# Patient Record
Sex: Male | Born: 1955 | Race: White | Hispanic: No | Marital: Married | Smoking: Current every day smoker
Health system: Southern US, Community
[De-identification: ages and names within clinical notes are randomized; demographics above are authoritative.]

## PROBLEM LIST (undated history)

## (undated) DIAGNOSIS — E7800 Pure hypercholesterolemia, unspecified: Secondary | ICD-10-CM

## (undated) DIAGNOSIS — K219 Gastro-esophageal reflux disease without esophagitis: Secondary | ICD-10-CM

## (undated) DIAGNOSIS — I519 Heart disease, unspecified: Secondary | ICD-10-CM

## (undated) DIAGNOSIS — H905 Unspecified sensorineural hearing loss: Secondary | ICD-10-CM

## (undated) DIAGNOSIS — I639 Cerebral infarction, unspecified: Secondary | ICD-10-CM

## (undated) DIAGNOSIS — G2581 Restless legs syndrome: Secondary | ICD-10-CM

## (undated) DIAGNOSIS — F32A Depression, unspecified: Secondary | ICD-10-CM

## (undated) DIAGNOSIS — M1990 Unspecified osteoarthritis, unspecified site: Secondary | ICD-10-CM

## (undated) DIAGNOSIS — F329 Major depressive disorder, single episode, unspecified: Secondary | ICD-10-CM

## (undated) DIAGNOSIS — J449 Chronic obstructive pulmonary disease, unspecified: Secondary | ICD-10-CM

## (undated) DIAGNOSIS — I10 Essential (primary) hypertension: Secondary | ICD-10-CM

## (undated) DIAGNOSIS — I219 Acute myocardial infarction, unspecified: Secondary | ICD-10-CM

## (undated) HISTORY — DX: Cerebral infarction, unspecified: I63.9

## (undated) HISTORY — DX: Heart disease, unspecified: I51.9

## (undated) HISTORY — DX: Restless legs syndrome: G25.81

## (undated) HISTORY — DX: Acute myocardial infarction, unspecified: I21.9

## (undated) HISTORY — DX: Gastro-esophageal reflux disease without esophagitis: K21.9

## (undated) HISTORY — DX: Depression, unspecified: F32.A

## (undated) HISTORY — DX: Essential (primary) hypertension: I10

## (undated) HISTORY — DX: Pure hypercholesterolemia, unspecified: E78.00

## (undated) HISTORY — DX: Unspecified osteoarthritis, unspecified site: M19.90

## (undated) HISTORY — DX: Unspecified sensorineural hearing loss: H90.5

## (undated) HISTORY — DX: Chronic obstructive pulmonary disease, unspecified: J44.9

---
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
## 2013-09-09 ENCOUNTER — Encounter (INDEPENDENT_AMBULATORY_CARE_PROVIDER_SITE_OTHER): Payer: Self-pay | Admitting: *Deleted

## 2013-10-14 ENCOUNTER — Other Ambulatory Visit (INDEPENDENT_AMBULATORY_CARE_PROVIDER_SITE_OTHER): Payer: Self-pay | Admitting: *Deleted

## 2013-10-14 ENCOUNTER — Encounter (INDEPENDENT_AMBULATORY_CARE_PROVIDER_SITE_OTHER): Payer: Self-pay | Admitting: Internal Medicine

## 2013-10-14 ENCOUNTER — Encounter (INDEPENDENT_AMBULATORY_CARE_PROVIDER_SITE_OTHER): Payer: Self-pay | Admitting: *Deleted

## 2013-10-14 ENCOUNTER — Ambulatory Visit (INDEPENDENT_AMBULATORY_CARE_PROVIDER_SITE_OTHER): Payer: Non-veteran care | Admitting: Internal Medicine

## 2013-10-14 VITALS — BP 128/70 | HR 76 | Temp 98.2°F | Ht 68.0 in | Wt 175.3 lb

## 2013-10-14 DIAGNOSIS — R195 Other fecal abnormalities: Secondary | ICD-10-CM

## 2013-10-14 DIAGNOSIS — I10 Essential (primary) hypertension: Secondary | ICD-10-CM

## 2013-10-14 DIAGNOSIS — I639 Cerebral infarction, unspecified: Secondary | ICD-10-CM | POA: Insufficient documentation

## 2013-10-14 DIAGNOSIS — E7800 Pure hypercholesterolemia, unspecified: Secondary | ICD-10-CM

## 2013-10-14 DIAGNOSIS — I6350 Cerebral infarction due to unspecified occlusion or stenosis of unspecified cerebral artery: Secondary | ICD-10-CM

## 2013-10-14 NOTE — Progress Notes (Signed)
   Subjective:    Patient ID: Nicholas Blackburn, male    DOB: [birth year], 57 y.o.   MRN: [id number]  HPI Referred to our office by Vanetta Mulders [facility] for blood in stool. [facility]. Wife states the [place] is backed up. Last colonoscopy was 2000 and was normal. Next colonoscopy in 10 yrs. He did have one positive card  8 weeks. Appetite is good. No weight loss. No abdominal pain. Occasionally has acid reflux if he eats spicy foods. He tries to avoid spicy foods. Usually has a BM about twice a day. No melena or BRRB.  His stools are formed.   08/25/2013 H and H 15.4 and 45.2.    Review of Systems Past Medical History  Diagnosis Date  . Hypertension   . CVA (cerebral infarction)   . MI (myocardial infarction)     1st of June and clarified by Dr. Mikael Spray at the [facility]  . High cholesterol     Past Surgical History  Procedure Laterality Date  . None      No Known Allergies  No current outpatient prescriptions on file prior to visit.   No current facility-administered medications on file prior to visit.   Current Outpatient Prescriptions  Medication Sig Dispense Refill  . amLODipine (NORVASC) 10 MG tablet Take 10 mg by mouth daily.      Marland Kitchen aspirin 325 MG EC tablet Take 325 mg by mouth daily.      . carbidopa-levodopa (PARCOPA) 25-100 MG per disintegrating tablet Take 1 tablet by mouth 3 (three) times daily.      . citalopram (CELEXA) 40 MG tablet Take 40 mg by mouth daily.      . diazepam (VALIUM) 10 MG tablet Take 10 mg by mouth every 6 (six) hours as needed for anxiety.      Marland Kitchen ibuprofen (ADVIL,MOTRIN) 800 MG tablet Take 800 mg by mouth every 8 (eight) hours as needed.      . meloxicam (MOBIC) 15 MG tablet Take 15 mg by mouth daily.      Marland Kitchen omeprazole (PRILOSEC) 20 MG capsule Take 20 mg by mouth daily.       No current facility-administered medications for this visit.        Objective:   Physical Exam Filed Vitals:   10/14/13 1613  BP: 128/70  Pulse: 76    Temp: 98.2 F (36.8 C)  Height:  (1.727 m)  Weight: 175 lb 4.8 oz (79.516 kg)   Alert and oriented. Skin warm and dry. Oral mucosa is moist.   . Sclera anicteric, conjunctivae is pink. Thyroid not enlarged. No cervical lymphadenopathy. Lungs clear. Heart regular rate and rhythm.  Abdomen is soft. Bowel sounds are positive. No hepatomegaly. No abdominal masses felt. No tenderness.  No edema to lower extremities.         Assessment & Plan:  Guaiac positive stool. Colonic neoplasm, AVM, polyp, hemorrhoid needs to be ruled out.

## 2013-10-14 NOTE — Patient Instructions (Signed)
Colonoscopy.  The risks and benefits such as perforation, bleeding, and infection were reviewed with the patient and is agreeable. 

## 2013-10-27 HISTORY — PX: CARDIAC CATHETERIZATION: SHX172

## 2013-11-02 ENCOUNTER — Encounter (HOSPITAL_COMMUNITY): Payer: Self-pay | Admitting: Pharmacy Technician

## 2013-11-17 ENCOUNTER — Encounter (HOSPITAL_COMMUNITY): Payer: Self-pay | Admitting: *Deleted

## 2013-11-17 ENCOUNTER — Encounter (HOSPITAL_COMMUNITY)
Admission: RE | Disposition: A | Discharge status: Home / Self Care | Payer: Self-pay | Source: Ambulatory Visit | Attending: Internal Medicine

## 2013-11-17 ENCOUNTER — Ambulatory Visit (HOSPITAL_COMMUNITY)
Admission: RE | Admit: 2013-11-17 | Discharge: 2013-11-17 | Disposition: A | Discharge status: Home / Self Care | Payer: Non-veteran care | Source: Ambulatory Visit | Attending: Internal Medicine | Admitting: Internal Medicine

## 2013-11-17 DIAGNOSIS — I2510 Atherosclerotic heart disease of native coronary artery without angina pectoris: Secondary | ICD-10-CM | POA: Insufficient documentation

## 2013-11-17 DIAGNOSIS — K5730 Diverticulosis of large intestine without perforation or abscess without bleeding: Secondary | ICD-10-CM

## 2013-11-17 DIAGNOSIS — Z8673 Personal history of transient ischemic attack (TIA), and cerebral infarction without residual deficits: Secondary | ICD-10-CM | POA: Insufficient documentation

## 2013-11-17 DIAGNOSIS — K649 Unspecified hemorrhoids: Secondary | ICD-10-CM | POA: Diagnosis not present

## 2013-11-17 DIAGNOSIS — I252 Old myocardial infarction: Secondary | ICD-10-CM | POA: Insufficient documentation

## 2013-11-17 DIAGNOSIS — R195 Other fecal abnormalities: Secondary | ICD-10-CM | POA: Diagnosis present

## 2013-11-17 DIAGNOSIS — K921 Melena: Secondary | ICD-10-CM | POA: Diagnosis not present

## 2013-11-17 DIAGNOSIS — E780 Pure hypercholesterolemia: Secondary | ICD-10-CM | POA: Insufficient documentation

## 2013-11-17 DIAGNOSIS — Z800 Family history of malignant neoplasm of digestive organs: Secondary | ICD-10-CM | POA: Insufficient documentation

## 2013-11-17 DIAGNOSIS — K644 Residual hemorrhoidal skin tags: Secondary | ICD-10-CM | POA: Insufficient documentation

## 2013-11-17 DIAGNOSIS — F17210 Nicotine dependence, cigarettes, uncomplicated: Secondary | ICD-10-CM | POA: Insufficient documentation

## 2013-11-17 DIAGNOSIS — Z955 Presence of coronary angioplasty implant and graft: Secondary | ICD-10-CM | POA: Diagnosis not present

## 2013-11-17 DIAGNOSIS — I10 Essential (primary) hypertension: Secondary | ICD-10-CM | POA: Diagnosis not present

## 2013-11-17 HISTORY — PX: COLONOSCOPY: SHX5424

## 2013-11-17 SURGERY — COLONOSCOPY
Anesthesia: Moderate Sedation

## 2013-11-17 MED ORDER — MIDAZOLAM HCL 5 MG/5ML IJ SOLN
INTRAMUSCULAR | Status: AC
  Filled 2013-11-17: qty 5

## 2013-11-17 MED ORDER — MIDAZOLAM HCL 5 MG/5ML IJ SOLN
INTRAMUSCULAR | Status: DC | PRN
  Administered 2013-11-17: 3 mg via INTRAVENOUS
  Administered 2013-11-17: 2 mg via INTRAVENOUS
  Administered 2013-11-17: 3 mg via INTRAVENOUS
  Administered 2013-11-17: 2 mg via INTRAVENOUS
  Administered 2013-11-17: 3 mg via INTRAVENOUS

## 2013-11-17 MED ORDER — MEPERIDINE HCL 50 MG/ML IJ SOLN
INTRAMUSCULAR | Status: DC | PRN
  Administered 2013-11-17 (×2): 25 mg via INTRAVENOUS

## 2013-11-17 MED ORDER — SODIUM CHLORIDE 0.9 % IV SOLN
INTRAVENOUS | Status: DC
Start: 2013-11-17 — End: 2013-11-17
  Administered 2013-11-17: 08:00:00 via INTRAVENOUS

## 2013-11-17 MED ORDER — STERILE WATER FOR IRRIGATION IR SOLN
Status: DC | PRN
Start: 2013-11-17 — End: 2013-11-17
  Administered 2013-11-17: 09:00:00

## 2013-11-17 MED ORDER — MEPERIDINE HCL 50 MG/ML IJ SOLN
INTRAMUSCULAR | Status: AC
  Filled 2013-11-17: qty 1

## 2013-11-17 MED ORDER — MIDAZOLAM HCL 5 MG/5ML IJ SOLN
INTRAMUSCULAR | Status: AC
  Filled 2013-11-17: qty 10

## 2013-11-17 NOTE — Op Note (Signed)
COLONOSCOPY PROCEDURE REPORT  PATIENT:  Nicholas Blackburn  MR#:  [id number] Birthdate:  05-07-1955, 58 y.o., male Endoscopist:  Dr. Malissa HippoNajeeb U. Bryce Cheever, MD Referred By:  Dr. Gerrit Friendsobert M. Jayme CloudFoster Jr, MD Procedure Date: 11/17/2013  Procedure:   Colonoscopy  Indications:  Patient is a 58 year old Caucasian male who was found to have heme-positive stool and therefore undergoing diagnostic colonoscopy. He denies melena or rectal bleeding abdominal pain nausea or vomiting. Patient takes ibuprofen and/or meloxicam on as-needed basis. Patient had coronary stenting three weeks ago and is on Plavix and aspirin. He did not call office about change in his history. He is feeling well and wants to proceed with procedure. If he needs polypectomy would consider placing clips to reduce risk of bleeding.  Informed Consent:  The procedure and risks were reviewed with the patient and informed consent was obtained.  Medications:  Demerol 50 mg IV Versed 13 mg IV  Description of procedure:  After a digital rectal exam was performed, that colonoscope was advanced from the anus through the rectum and colon to the area of the cecum, ileocecal valve and appendiceal orifice. The cecum was deeply intubated. These structures were well-seen and photographed for the record. From the level of the cecum and ileocecal valve, the scope was slowly and cautiously withdrawn. The mucosal surfaces were carefully surveyed utilizing scope tip to flexion to facilitate fold flattening as needed. The scope was pulled down into the rectum where a thorough exam including retroflexion was performed.  Findings:   Prep excellent. Scattered diverticula at sigmoid colon. Normal rectal mucosa. Small hemorrhoids below the dentate line.    Therapeutic/Diagnostic Maneuvers Performed:  None  Complications:  None  Cecal Withdrawal Time:  6 minutes  Impression:  Examination performed the cecum. Mild sigmoid colon diverticulosis. Small external  hemorrhoids.  Comment; Heme-positive stool may be secondary to NSAID use. Since patient has no GI symptoms and H&H was normal no further workup indicated that he should get CBC checked in 3 months.  Recommendations:  Standard instructions given. Patient advised not to take OTC NSAIDs. Next colonoscopy in 10 years.  Lesta Limbert U  11/17/2013 9:01 AM  CC: Dr. Jayme CloudFOSTER Jr., Gerrit FriendsOBERT M, MD & Dr. Bonnetta BarryNo ref. provider found

## 2013-11-17 NOTE — Discharge Instructions (Signed)
Do not take on take Ibuprofen and meloxicam. Resume other medications as before. Plan take Tylenol up to 2 g per day on an as-needed basis for headache or musculoskeletal pain. High fiber diet. No driving for 24 hours. Please ask your physician to check CBC In 3 months

## 2013-11-17 NOTE — H&P (Signed)
Nicholas Blackburn is an 58 y.o. male.   Chief Complaint: Patient is here for colonoscopy. HPI: Patient is a 58 year old Caucasian male who was found to have [facility] [facility] [place] [place] is referred to our office for colonoscopy[facility]. There is no history of abdominal pain melena rectal bleeding nausea vomiting or dysphagia. Heartburn is well controlled with therapy. Last colonoscopy was in 2000 was normal. Patient has history of coronary artery disease. Back in June he was noted to have abnormal EKG suggestive of an old infarct. He was not hospitalized. He had cardiac catheter on 10/27/2013 and had single vessel stented. He has not had any problems. He denies chest pain shortness of breath PND or orthopnea. Patient did not contact her office to update us as to change in his history. He wants to proceed with colonoscopy rather than postponing it. He wants to make sure he does not have a large polyp or mass. Hemoglobin on 08/25/2013 was 15.4.  Past Medical History  Diagnosis Date  . Hypertension   . CVA (cerebral infarction)   . High cholesterol   . MI (myocardial infarction) based on abnormal EKG. Never hospitalized for MI     1st of June [facility]          Past Surgical History  Procedure Laterality Date  . Cardiac catheterization  10/27/2013    with stent    Family History  Problem Relation Age of Onset  . Colon cancer Neg Hx    Social History:  reports that he has been smoking Cigarettes.  He has a 10 pack-year smoking history. He does not have any smokeless tobacco history on file. He reports that he drinks alcohol. He reports that he does not use illicit drugs.  Allergies: No Known Allergies  Medications Prior to Admission  Medication Sig Dispense Refill  . amLODipine (NORVASC) 10 MG tablet Take 10 mg by mouth daily.      Marland Kitchen. aspirin 325 MG EC tablet Take 81 mg by mouth daily.       Marland Kitchen. aspirin 81 MG  tablet Take 81 mg by mouth daily.      . carbidopa-levodopa (PARCOPA) 25-100 MG per disintegrating tablet Take 1 tablet by mouth 3 (three) times daily.      . citalopram (CELEXA) 40 MG tablet Take 40 mg by mouth daily.      . clopidogrel (PLAVIX) 75 MG tablet Take 75 mg by mouth daily.      . diazepam (VALIUM) 10 MG tablet Take 10 mg by mouth every 6 (six) hours as needed for anxiety.      Marland Kitchen. ibuprofen (ADVIL,MOTRIN) 800 MG tablet Take 800 mg by mouth every 8 (eight) hours as needed.      . meloxicam (MOBIC) 15 MG tablet Take 15 mg by mouth daily.      Marland Kitchen. omeprazole (PRILOSEC) 20 MG capsule Take 20 mg by mouth daily.        No results found for this or any previous visit (from the past 48 hour(s)). No results found.  ROS  Blood pressure 147/75, pulse 66, temperature 97.8 F (36.6 C), temperature source Oral, resp. rate 18, height 5\' 8"  (1.727 m), weight 172 lb (78.019 kg), SpO2 96.00%. Physical Exam  Constitutional: He appears well-developed and well-nourished.  HENT:  Mouth/Throat: Oropharynx is clear and moist.  Eyes: Conjunctivae are normal. No scleral icterus.  Neck: No thyromegaly  present.  Cardiovascular: Normal rate, regular rhythm and normal heart sounds.   No murmur heard. Respiratory: Effort normal and breath sounds normal.  GI: Soft. He exhibits no distension and no mass. There is no tenderness.  Musculoskeletal: He exhibits no edema.  Lymphadenopathy:    He has no cervical adenopathy.  Neurological: He is alert.  Skin: Skin is warm and dry.     Assessment/Plan Heme positive stool. History of coronary artery disease with stenting 3 weeks ago and patient is on Plavix. Diagnostic colonoscopy.  REHMAN,NAJEEB U 11/17/2013, 8:22 AM

## 2013-11-17 NOTE — OR Nursing (Signed)
Dr. Karilyn Cotaehman notified of patient having a MI in July and having a stent placed on September 23rd. Patient was placed on Plavix and is taking for 30 days with last dose yesterday. Dr. Karilyn Cotaehman said ok for patient to proceed with procedure.

## 2013-11-18 ENCOUNTER — Encounter (HOSPITAL_COMMUNITY): Payer: Self-pay | Admitting: Internal Medicine

## 2014-01-19 ENCOUNTER — Encounter (INDEPENDENT_AMBULATORY_CARE_PROVIDER_SITE_OTHER): Payer: Self-pay

## 2018-07-23 NOTE — Progress Notes (Deleted)
Nicholas Blackburn was seen today in neurologic consultation at the request of Aros, Rubie Maid, MD.  The consultation is for the evaluation of restless leg syndrome.  No records accompanied the patient.  Patient reports that the primary symptom he has is ***.  He reports that he has had this symptom for approximately ***years.  He is currently on carbidopa/levodopa 25/100 oral dissolving tablet, 1 tablet 3 times per day for this.  In the past, he has been on ***.    PREVIOUS MEDICATIONS: ***  ALLERGIES:   Allergies  Allergen Reactions  . Lisinopril   . Propoxyphene Napsylate [Propoxyphene]     DARVOCET-N  . Viagra [Sildenafil Citrate]     100 mg    CURRENT MEDICATIONS:  Outpatient Encounter Medications as of 07/27/2018  Medication Sig  . amLODipine (NORVASC) 10 MG tablet Take 10 mg by mouth daily.  Marland Kitchen aspirin 325 MG EC tablet Take 81 mg by mouth daily.   Marland Kitchen aspirin 81 MG tablet Take 81 mg by mouth daily.  . carbidopa-levodopa (PARCOPA) 25-100 MG per disintegrating tablet Take 1 tablet by mouth 3 (three) times daily.  . citalopram (CELEXA) 40 MG tablet Take 40 mg by mouth daily.  . clopidogrel (PLAVIX) 75 MG tablet Take 75 mg by mouth daily.  . diazepam (VALIUM) 10 MG tablet Take 10 mg by mouth every 6 (six) hours as needed for anxiety.  Marland Kitchen omeprazole (PRILOSEC) 20 MG capsule Take 20 mg by mouth daily.   No facility-administered encounter medications on file as of 07/27/2018.     PAST MEDICAL HISTORY:   Past Medical History:  Diagnosis Date  . Chronic obstructive lung disease ([place])   . CVA (cerebral infarction)   . Depression   . Esophageal reflux   . Hearing loss, sensorineural   . Heart disease   . High cholesterol   . Hypertension   . MI (myocardial infarction) [facility])    1st of June and clarified by Dr. Kellie Shropshire at the [facility]  . Osteoarthrosis   . Restless leg syndrome     PAST SURGICAL HISTORY:   Past Surgical History:  Procedure Laterality Date  . CARDIAC  CATHETERIZATION  10/27/2013   with stent  . COLONOSCOPY N/A 11/17/2013   Procedure: COLONOSCOPY;  Surgeon: Rogene Houston, MD;  Location: [facility];  Service: Endoscopy;  Laterality: N/A;  830    SOCIAL HISTORY:   Social History   Socioeconomic History  . Marital status: Married    Spouse name: Not on file  . Number of children: Not on file  . Years of education: Not on file  . Highest education level: Not on file  Occupational History  . Not on file  Social Needs  . Financial resource strain: Not on file  . Food insecurity    Worry: Not on file    Inability: Not on file  . Transportation needs    Medical: Not on file    Non-medical: Not on file  Tobacco Use  . Smoking status: Current Every Day Smoker    Packs/day: 0.25    Years: 40.00    Pack years: 10.00    Types: Cigarettes  . Tobacco comment: 3/4 pack since age 63 or 71  Substance and Sexual Activity  . Alcohol use: Yes    Comment: occasionally-once per month  . Drug use: No  . Sexual activity: Not on file  Lifestyle  . Physical activity    Days per week: Not on  file    Minutes per session: Not on file  . Stress: Not on file  Relationships  . Social Musicianconnections    Talks on phone: Not on file    Gets together: Not on file    Attends religious service: Not on file    Active member of club or organization: Not on file    Attends meetings of clubs or organizations: Not on file    Relationship status: Not on file  . Intimate partner violence    Fear of current or ex partner: Not on file    Emotionally abused: Not on file    Physically abused: Not on file    Forced sexual activity: Not on file  Other Topics Concern  . Not on file  Social History Narrative  . Not on file    FAMILY HISTORY:   Family Status  Relation Name Status  . Mother  Deceased       CVA,  . Father  Deceased       MI  . Brother 1 Deceased       AIDs  . Other married Alive  . Son 1 Alive       good health    ROS:  ROS   PHYSICAL EXAMINATION:    VITALS:  There were no vitals filed for this visit.  GEN:  Normal appears male in no acute distress.  Appears stated age. HEENT:  Normocephalic, atraumatic. The mucous membranes are moist. The superficial temporal arteries are without ropiness or tenderness. Cardiovascular: Regular rate and rhythm. Lungs: Clear to auscultation bilaterally. Neck/Heme: There are no carotid bruits noted bilaterally.  NEUROLOGICAL: Orientation:  The patient is alert and oriented x 3.  Fund of knowledge is appropriate.  Recent and remote memory intact.  Attention span and concentration normal.  Repeats and names without difficulty. Cranial nerves: There is good facial symmetry. The pupils are equal round and reactive to light bilaterally. Fundoscopic exam reveals clear disc margins bilaterally. Extraocular muscles are intact and visual fields are full to confrontational testing. Speech is fluent and clear. Soft palate rises symmetrically and there is no tongue deviation. Hearing is intact to conversational tone. Tone: Tone is good throughout. Sensation: Sensation is intact to light touch and pinprick throughout (facial, trunk, extremities). Vibration is intact at the bilateral big toe. There is no extinction with double simultaneous stimulation. There is no sensory dermatomal level identified. Coordination:  The patient has no difficulty with RAM's or FNF bilaterally. Motor: Strength is 5/5 in the bilateral upper and lower extremities.  Shoulder shrug is equal and symmetric. There is no pronator drift.  There are no fasciculations noted. DTR's: Deep tendon reflexes are 2/4 at the bilateral biceps, triceps, brachioradialis, patella and achilles.  Plantar responses are downgoing bilaterally. Gait and Station: The patient is able to ambulate without difficulty. The patient is able to heel toe walk without any difficulty. The patient is able to ambulate in a tandem fashion. The patient is able to  stand in the Romberg position.  Labs: [facility].  Sodium was 139, potassium was 4.8, CO2 was 27, AST was 19, ALT was 32, glucose 101.  White blood cells were 14, hemoglobin 16.4, hematocrit 48.4, platelets 359.  Total cholesterol was 155.  No other labs were sent.   IMPRESSION/PLAN  1. ***Restless leg  -Discussed nature and pathophysiology.  -We will do lab work including TSH, chemistry, iron studies, ferritin  -  Currently is on carbidopa/levodopa 25/100, 1 tablet 3 times per day.  This is not exactly standard of care therapy for restless leg.  It can definitely be useful, but often is used as adjunct therapy and not primary therapy.  In addition, it is usually just use at bedtime and not throughout the day.  He is also on a very expensive oral dissolving formulation.   Cc:  Frederich ChickFoster, Robert M Jr., MD

## 2018-07-27 ENCOUNTER — Ambulatory Visit: Payer: Non-veteran care | Admitting: Neurology

## 2018-08-17 ENCOUNTER — Ambulatory Visit: Payer: Non-veteran care | Admitting: Neurology

## 2018-09-24 NOTE — Progress Notes (Signed)
Nicholas Blackburn was seen today in neurologic consultation at the request of Sherald Hess., MD.  The consultation is for the evaluation of restless leg syndrome.  Notes available to me are reviewed, although they are very limited.  Patient was referred quite some time ago, but canceled and no showed a few appointments.  Pt states that sx's x 10 years.  Pt states that sx's are in shoulders he feels like there is a tourniquet on them.  He will sometimes have that feeling in the legs.  He will have involuntary jerking of legs and arms.  It usually happens when sitting and resting but occasionally will happen at work.  He states that he will sometimes feel an "involuntary drawing up" of the legs and if he gets up and walks around it helps them.  He was on carbidopa/levodopa 25/100 at bedtime, and according to records from the [facility] has been on that for years.  Pt estimates he was on it for 2 years but pt states that it didn't help.  He states that Mg has helped some.  He has a drawing sensation, "kicking and cramping" that will awaken him at night.  "It wasn't as bad when I was on my valium but they took me off of it when I was 63."   PREVIOUS MEDICATIONS: carbidopa/levodopa   ALLERGIES:   Allergies  Allergen Reactions  . Lisinopril   . Propoxyphene Napsylate [Propoxyphene]     DARVOCET-N  . Viagra [Sildenafil Citrate]     100 mg    CURRENT MEDICATIONS:  Outpatient Encounter Medications as of 62/24/2020  Medication Sig  . amLODipine (NORVASC) 10 MG tablet Take 10 mg by mouth daily.  Marland Kitchen aspirin 325 MG EC tablet Take 81 mg by mouth daily.   . citalopram (CELEXA) 40 MG tablet Take 40 mg by mouth daily.  . clopidogrel (PLAVIX) 75 MG tablet Take 75 mg by mouth daily.  . magnesium gluconate (MAGONATE) 500 MG tablet Take 500 mg by mouth 2 (two) times daily.  Marland Kitchen HYDROcodone-acetaminophen (NORCO/VICODIN) 5-325 MG tablet Take 1 tablet by mouth every 6 (six) hours as needed.  . meloxicam  (MOBIC) 15 MG tablet Take 15 mg by mouth daily.  Marland Kitchen omeprazole (PRILOSEC) 20 MG capsule Take 20 mg by mouth daily.  . [DISCONTINUED] aspirin 81 MG tablet Take 81 mg by mouth daily.  . [DISCONTINUED] carbidopa-levodopa (PARCOPA) 25-100 MG per disintegrating tablet Take 1 tablet by mouth 3 (three) times daily.  . [DISCONTINUED] diazepam (VALIUM) 10 MG tablet Take 10 mg by mouth every 6 (six) hours as needed for anxiety.   No facility-administered encounter medications on file as of 62/24/2020.     PAST MEDICAL HISTORY:   Past Medical History:  Diagnosis Date  . Chronic obstructive lung disease ([place])   . CVA (cerebral infarction)   . Depression   . Esophageal reflux   . Hearing loss, sensorineural   . Heart disease   . High cholesterol   . Hypertension   . MI (myocardial infarction) [facility])    1st of June and clarified by Dr. Kellie Shropshire at the [facility]  . Osteoarthrosis   . Restless leg syndrome     PAST SURGICAL HISTORY:   Past Surgical History:  Procedure Laterality Date  . CARDIAC CATHETERIZATION  10/27/2013   with stent  . COLONOSCOPY N/A 11/17/2013   Procedure: COLONOSCOPY;  Surgeon: Rogene Houston, MD;  Location: [facility];  Service: Endoscopy;  Laterality:  N/A;  830    SOCIAL HISTORY:   Social History   Socioeconomic History  . Marital status: Married    Spouse name: gIgi  . Number of children: 1  . Years of education: 7112  . Highest education level: High school graduate  Occupational History  . Not on file  Social Needs  . Financial resource strain: Not on file  . Food insecurity    Worry: Not on file    Inability: Not on file  . Transportation needs    Medical: Not on file    Non-medical: Not on file  Tobacco Use  . Smoking status: Current Every Day Smoker    Packs/day: 0.25    Years: 40.00    Pack years: 10.00    Types: Cigarettes  . Smokeless tobacco: Never Used  . Tobacco comment: 3/4 pack since age 63 or 6212  Substance and Sexual Activity  . Alcohol  use: Yes    Alcohol/week: 1.0 standard drinks    Types: 1 Cans of beer per week    Comment: occasionally-once per month  . Drug use: No  . Sexual activity: Not on file  Lifestyle  . Physical activity    Days per week: Not on file    Minutes per session: Not on file  . Stress: Not on file  Relationships  . Social Musicianconnections    Talks on phone: Not on file    Gets together: Not on file    Attends religious service: Not on file    Active member of club or organization: Not on file    Attends meetings of clubs or organizations: Not on file    Relationship status: Not on file  . Intimate partner violence    Fear of current or ex partner: Not on file    Emotionally abused: Not on file    Physically abused: Not on file    Forced sexual activity: Not on file  Other Topics Concern  . Not on file  Social History Narrative   Pt lives at home with Spouse Gigi in 2 story home, he has 1 son, and high school graduate.   He is right handed, he drinks coffee and soda, sometime tea.    FAMILY HISTORY:   Family Status  Relation Name Status  . Mother  Deceased       CVA,  . Father  Deceased       MI  . Brother 1 Deceased       AIDs  . Other married Alive  . Son 1 Alive       good health  . Neg Hx  (Not Specified)    ROS:  Review of Systems  Constitutional: Negative.   HENT: Negative.   Eyes: Negative.   Respiratory: Negative.   Cardiovascular: Negative.   Gastrointestinal: Negative.   Genitourinary: Positive for frequency and urgency.  Musculoskeletal: Positive for back pain and neck pain.       "pain all over"  Skin: Negative.   Endo/Heme/Allergies: Negative.   Psychiatric/Behavioral: Negative.     PHYSICAL EXAMINATION:    VITALS:   Vitals:   09/28/18 1423  BP: 136/84  Pulse: 74  SpO2: 96%  Weight: 182 lb 9.6 oz (82.8 kg)  Height: 5\' 8"  (1.727 m)    GEN:  Normal appears male in no acute distress.  Appears stated age. HEENT:  Normocephalic, atraumatic. The mucous  membranes are moist. The superficial temporal arteries are without ropiness or tenderness. Cardiovascular: Regular  rate and rhythm. Lungs: Clear to auscultation bilaterally. Neck/Heme: There are no carotid bruits noted bilaterally.  NEUROLOGICAL: Orientation:  The patient is alert and oriented x 3.  Fund of knowledge is appropriate.  Recent and remote memory intact.  Attention span and concentration normal.  Repeats and names without difficulty. Cranial nerves: There is good facial symmetry.  Extraocular muscles are intact and visual fields are full to confrontational testing. Speech is fluent and clear. Soft palate rises symmetrically and there is no tongue deviation. Hearing is intact to conversational tone. Tone: Tone is good throughout. Sensation: Sensation is intact to light touch and pinprick throughout (facial, trunk, extremities). Vibration is intact at the bilateral big toe. There is no extinction with double simultaneous stimulation. There is no sensory dermatomal level identified. Coordination:  The patient has no difficulty with RAM's or FNF bilaterally. Motor: Strength is 5/5 in the bilateral upper and lower extremities.  Shoulder shrug is equal and symmetric. There is no pronator drift.  There are no fasciculations noted. DTR's: Deep tendon reflexes are 0-1/4 at the bilateral biceps, triceps, brachioradialis, patella and achilles.  Plantar responses are downgoing bilaterally. Gait and Station: The patient is able to ambulate without difficulty. The patient is able to heel toe walk without any difficulty. The patient is able to ambulate in a tandem fashion. The patient is able to stand in the Romberg position.   IMPRESSION/PLAN  1. Possible RLS  -Discussed with the patient that I am not sure that all of his symptoms are restless leg, as opposed to being part of his larger chronic pain syndrome.  He initially describes having muscle spasms in between his shoulders as a symptom of the  restless leg (it was the first symptom that he described when I asked him to describe it).  He also stated that he was doing well until 2 years ago when the [facility] took him off of his Valium because of his age.  Valium generally is not considered a treatment for restless leg, but would certainly treat muscle spasm.  He also did not find benefit to levodopa.  While levodopa would not be my first line treatment for restless leg, it generally does work.  I told him we would try low-dose pramipexole and see if he has benefit.  We will try pramipexole 0.125 mg at bedtime.  We discussed risk, benefits, and side effects.  He expressed understanding. -We will do lab work to look for possible causes, including TSH, CBC, iron studies, ferritin.  We discussed the phenomenon of augmentation, seen commonly with the dopamine agonists. -Patient is to call me in 2 weeks if he does not feel better.  Again, I do think that chronic pain may be part of the bigger issue.  He is on opioid therapy chronically.  Generally, that does help restless leg, if it is present. -Follow-up in 6 months, sooner should new neurologic issues arise.     Cc:  Frederich ChickFoster, Robert M Jr., MD

## 2018-09-28 ENCOUNTER — Telehealth: Payer: Self-pay

## 2018-09-28 ENCOUNTER — Encounter: Payer: Self-pay | Admitting: Neurology

## 2018-09-28 ENCOUNTER — Ambulatory Visit (INDEPENDENT_AMBULATORY_CARE_PROVIDER_SITE_OTHER): Payer: No Typology Code available for payment source | Admitting: Neurology

## 2018-09-28 ENCOUNTER — Other Ambulatory Visit: Payer: Self-pay

## 2018-09-28 ENCOUNTER — Other Ambulatory Visit (INDEPENDENT_AMBULATORY_CARE_PROVIDER_SITE_OTHER): Payer: No Typology Code available for payment source

## 2018-09-28 VITALS — BP 136/84 | HR 74 | Ht 68.0 in | Wt 182.6 lb

## 2018-09-28 DIAGNOSIS — R6889 Other general symptoms and signs: Secondary | ICD-10-CM | POA: Diagnosis not present

## 2018-09-28 DIAGNOSIS — R3915 Urgency of urination: Secondary | ICD-10-CM | POA: Diagnosis not present

## 2018-09-28 DIAGNOSIS — G2581 Restless legs syndrome: Secondary | ICD-10-CM | POA: Diagnosis not present

## 2018-09-28 DIAGNOSIS — R5383 Other fatigue: Secondary | ICD-10-CM

## 2018-09-28 DIAGNOSIS — G894 Chronic pain syndrome: Secondary | ICD-10-CM

## 2018-09-28 LAB — CBC WITH DIFFERENTIAL/PLATELET
Basophils Absolute: 0.1 10*3/uL (ref 0.0–0.1)
Basophils Relative: 0.7 % (ref 0.0–3.0)
Eosinophils Absolute: 0.1 10*3/uL (ref 0.0–0.7)
Eosinophils Relative: 0.9 % (ref 0.0–5.0)
HCT: 42 % (ref 39.0–52.0)
Hemoglobin: 14.4 g/dL (ref 13.0–17.0)
Lymphocytes Relative: 18.8 % (ref 12.0–46.0)
Lymphs Abs: 2.3 10*3/uL (ref 0.7–4.0)
MCHC: 34.4 g/dL (ref 30.0–36.0)
MCV: 91.8 fl (ref 78.0–100.0)
Monocytes Absolute: 0.6 10*3/uL (ref 0.1–1.0)
Monocytes Relative: 4.7 % (ref 3.0–12.0)
Neutro Abs: 9.3 10*3/uL — ABNORMAL HIGH (ref 1.4–7.7)
Neutrophils Relative %: 74.9 % (ref 43.0–77.0)
Platelets: 402 10*3/uL — ABNORMAL HIGH (ref 150.0–400.0)
RBC: 4.57 Mil/uL (ref 4.22–5.81)
RDW: 13.1 % (ref 11.5–15.5)
WBC: 12.4 10*3/uL — ABNORMAL HIGH (ref 4.0–10.5)

## 2018-09-28 MED ORDER — PRAMIPEXOLE DIHYDROCHLORIDE 0.125 MG PO TABS: 0.1250 mg | ORAL_TABLET | Freq: Every day | ORAL | 1 refills | Status: DC

## 2018-09-28 NOTE — Patient Instructions (Signed)
Your provider has requested that you have labwork completed today. Please go to [facility] ([id number]) on the second floor of this building before leaving the office today. You do not need to check in. If you are not called within 15 minutes please check with the front desk.   

## 2018-09-28 NOTE — Telephone Encounter (Signed)
Called patient no answer left message to call office back for results. 

## 2018-09-28 NOTE — Progress Notes (Signed)
Copy of patient labs routed from Epic to PCP office

## 2018-09-28 NOTE — Telephone Encounter (Signed)
-----   Message from Miller, DO sent at 09/28/2018  4:14 PM EDT ----- Let pt know that WBC count was a tad up today (could potentially indicate infection).  He told me he just previously had labs done.  He should get to PCP about these just to make sure nothing else going on.  Please fax copy to PCP so they have copy of ours

## 2018-09-29 LAB — TSH: TSH: 1.26 u[IU]/mL (ref 0.35–4.50)

## 2018-09-29 LAB — IRON, TOTAL/TOTAL IRON BINDING CAP
%SAT: 16 % (calc) — ABNORMAL LOW (ref 20–48)
Iron: 60 ug/dL (ref 50–180)

## 2018-09-29 LAB — FERRITIN: Ferritin: 28.2 ng/mL (ref 22.0–322.0)

## 2018-09-29 LAB — IRON,?TOTAL/TOTAL IRON BINDING CAP: TIBC: 376 mcg/dL (calc) (ref 250–425)

## 2018-09-30 ENCOUNTER — Telehealth: Payer: Self-pay | Admitting: Neurology

## 2018-09-30 NOTE — Telephone Encounter (Signed)
Wife was calling in to get blood work results. Thanks!

## 2018-09-30 NOTE — Telephone Encounter (Signed)
Wife is calling in that the results needs to be sent to the [place] is now using that as his PCP. Please fax results to [place] at [place]: [place] [phone]. Thanks

## 2018-09-30 NOTE — Telephone Encounter (Signed)
See other telephone note from 09/28/18

## 2018-09-30 NOTE — Telephone Encounter (Signed)
Labs was sent to this number given

## 2018-09-30 NOTE — Telephone Encounter (Signed)
Pt wife called back she was informed of results. And to contact PCP for follow. Results fax to PCP as well

## 2018-10-01 ENCOUNTER — Telehealth: Payer: Self-pay | Admitting: Neurology

## 2018-10-01 NOTE — Telephone Encounter (Signed)
Noted. Will refax!

## 2018-10-01 NOTE — Telephone Encounter (Signed)
Levada Dy from [facility] was calling to let you know she only go the op sheet for the fax recently sent on this patient. Thanks!

## 2018-10-22 ENCOUNTER — Other Ambulatory Visit (HOSPITAL_COMMUNITY): Payer: Self-pay | Admitting: Family Medicine

## 2018-10-22 ENCOUNTER — Other Ambulatory Visit: Payer: Self-pay | Admitting: Family Medicine

## 2018-10-22 DIAGNOSIS — R918 Other nonspecific abnormal finding of lung field: Secondary | ICD-10-CM

## 2018-11-04 ENCOUNTER — Ambulatory Visit (HOSPITAL_COMMUNITY): Payer: Non-veteran care

## 2018-11-16 ENCOUNTER — Ambulatory Visit (HOSPITAL_COMMUNITY): Payer: Non-veteran care

## 2018-12-07 ENCOUNTER — Ambulatory Visit (HOSPITAL_COMMUNITY): Admission: RE | Admit: 2018-12-07 | Payer: No Typology Code available for payment source | Source: Ambulatory Visit

## 2019-01-04 ENCOUNTER — Encounter (HOSPITAL_COMMUNITY): Payer: Self-pay

## 2019-01-04 ENCOUNTER — Ambulatory Visit (HOSPITAL_COMMUNITY): Admission: RE | Admit: 2019-01-04 | Payer: No Typology Code available for payment source | Source: Ambulatory Visit

## 2019-01-14 ENCOUNTER — Ambulatory Visit (HOSPITAL_COMMUNITY): Payer: No Typology Code available for payment source

## 2019-02-08 ENCOUNTER — Other Ambulatory Visit: Payer: Self-pay | Admitting: Neurology

## 2019-02-23 ENCOUNTER — Other Ambulatory Visit (HOSPITAL_COMMUNITY): Payer: Self-pay | Admitting: Family Medicine

## 2019-02-23 DIAGNOSIS — I10 Essential (primary) hypertension: Secondary | ICD-10-CM

## 2019-03-15 ENCOUNTER — Ambulatory Visit (HOSPITAL_COMMUNITY): Payer: Non-veteran care

## 2019-04-01 ENCOUNTER — Other Ambulatory Visit: Payer: Self-pay | Admitting: Neurology

## 2019-04-02 NOTE — Progress Notes (Signed)
Nicholas Blackburn was seen today in follow up for RLS.  When I saw him last august, I was not sure that all the sx's were RLS related, as some of his symptoms sounded like they were part of his larger chronic pain syndrome (having muscle spasm and between the shoulders).  We did start him on low-dose pramipexole last visit.  Patient states that it really did help.  He asks me if we can up the dose just a little.  I did some lab work last visit.  His white count was a little bit up.  He was told to follow-up with his primary care doctor.  He reports that he followed up with the [facility] in [place].  He states that they did more blood work but they didn't follow up with the [facility].     PREVIOUS MEDICATIONS: Valium; levodopa; on chronic opioid therapy  ALLERGIES:   Allergies  Allergen Reactions  . Lisinopril   . Propoxyphene Napsylate [Propoxyphene]     DARVOCET-N  . Viagra [Sildenafil Citrate]     100 mg    CURRENT MEDICATIONS:  Outpatient Encounter Medications as of 04/05/2019  Medication Sig  . amLODipine (NORVASC) 10 MG tablet Take 10 mg by mouth daily.  Marland Kitchen aspirin 325 MG EC tablet Take 325 mg by mouth daily.   . citalopram (CELEXA) 40 MG tablet Take 40 mg by mouth daily.  . clopidogrel (PLAVIX) 75 MG tablet Take 75 mg by mouth daily.  Marland Kitchen HYDROcodone-acetaminophen (NORCO/VICODIN) 5-325 MG tablet Take 1 tablet by mouth every 6 (six) hours as needed.  . magnesium gluconate (MAGONATE) 500 MG tablet Take 500 mg by mouth 2 (two) times daily.  . meloxicam (MOBIC) 15 MG tablet Take 15 mg by mouth daily.  Marland Kitchen omeprazole (PRILOSEC) 20 MG capsule Take 20 mg by mouth daily.  . [DISCONTINUED] pramipexole (MIRAPEX) 0.125 MG tablet TAKE ONE TABLET BY MOUTH AT BEDTIME.  . pramipexole (MIRAPEX) 0.25 MG tablet Take 1 tablet (0.25 mg total) by mouth at bedtime.   No facility-administered encounter medications on file as of 04/05/2019.    PHYSICAL EXAMINATION:    VITALS:   Vitals:   04/05/19  1347  BP: (!) 158/84  Pulse: 67  SpO2: 95%  Weight: 176 lb (79.8 kg)  Height: 5\' 8"  (1.727 m)    GEN:  The patient appears stated age and is in NAD. HEENT:  Normocephalic, atraumatic.  The mucous membranes are moist. The superficial temporal arteries are without ropiness or tenderness. CV:  RRR Lungs:  CTAB Neck/HEME:  There are no carotid bruits bilaterally.  Neurological examination:  Orientation: The patient is alert and oriented x3. Cranial nerves: There is good facial symmetry.The speech is fluent and clear. Soft palate rises symmetrically and there is no tongue deviation. Hearing is intact to conversational tone. Sensation: Sensation is intact to light touch throughout Motor: Strength is at least antigravity x4.  Movement examination: Tone: There is normal tone in the UE/LE Abnormal movements:  no tremor.  No myoclonus.  No asterixis.   Coordination:  There is no decremation with RAM's. Gait and Station: The patient has no difficulty arising out of a deep-seated chair without the use of the hands. The patient's stride length is good.    Lab Results  Component Value Date   IRON 60 09/28/2018   TIBC 376 09/28/2018   FERRITIN 28.2 09/28/2018      ASSESSMENT/PLAN:  1.  RLS  -Doing well on pramipexole.  Asks if he can slightly increase the dose.  I have no objection to that.  He will take pramipexole, 0.25mg  at bed.  R/B/SE were discussed.  The opportunity to ask questions was given and they were answered to the best of my ability.  The patient expressed understanding and willingness to follow the outlined treatment protocols.  -has iron deficiency and may be contributing to sx's.  Add ferrous sulfate 325 mg bid.  Take orange juice or vitamin C 100-200 mg with each dose of ferrous sulfate.  Told him to make sure he was taking a stool softener.  2.  F/u 8 months  Total time spent on today's visit was 25 minutes, including both face-to-face time and nonface-to-face time.   Time included that spent on review of records (prior notes available to me/labs/imaging if pertinent), discussing treatment and goals, answering patient's questions and coordinating care.  Cc:  Sherald Hess., MD

## 2019-04-05 ENCOUNTER — Ambulatory Visit (INDEPENDENT_AMBULATORY_CARE_PROVIDER_SITE_OTHER): Payer: No Typology Code available for payment source | Admitting: Neurology

## 2019-04-05 ENCOUNTER — Encounter: Payer: Self-pay | Admitting: Neurology

## 2019-04-05 ENCOUNTER — Other Ambulatory Visit: Payer: Self-pay

## 2019-04-05 VITALS — BP 158/84 | HR 67 | Ht 68.0 in | Wt 176.0 lb

## 2019-04-05 DIAGNOSIS — G2581 Restless legs syndrome: Secondary | ICD-10-CM

## 2019-04-05 DIAGNOSIS — E611 Iron deficiency: Secondary | ICD-10-CM

## 2019-04-05 MED ORDER — PRAMIPEXOLE DIHYDROCHLORIDE 0.25 MG PO TABS: 0.2500 mg | ORAL_TABLET | Freq: Every evening | ORAL | 1 refills | Status: DC

## 2019-04-05 NOTE — Patient Instructions (Addendum)
1.  Get Ferrous Sulfate over the counter, 325 mg, 1 twice per day.  Take this with orange juice or vitamin C 100-200 mg with each ferrous sulfate dose 2.  Take a stool softener if you aren't already 3.  Increase pramipexole, 0.25 mg at bedtime.  You can double up on your current pramipexole until that one runs out.  The physicians and staff at [facility] are committed to providing excellent care. You may receive a survey requesting feedback about your experience at our office. We strive to receive "very good" responses to the survey questions. If you feel that your experience would prevent you from giving the office a "very good " response, please contact our office to try to remedy the situation. We may be reached at [phone]. Thank you for taking the time out of your busy day to complete the survey.

## 2019-04-15 ENCOUNTER — Ambulatory Visit: Payer: No Typology Code available for payment source

## 2019-05-31 ENCOUNTER — Ambulatory Visit (HOSPITAL_COMMUNITY)
Admission: RE | Admit: 2019-05-31 | Discharge: 2019-05-31 | Disposition: A | Discharge status: Home / Self Care | Payer: No Typology Code available for payment source | Source: Ambulatory Visit | Attending: Family Medicine | Admitting: Family Medicine

## 2019-05-31 ENCOUNTER — Other Ambulatory Visit: Payer: Self-pay

## 2019-05-31 DIAGNOSIS — R918 Other nonspecific abnormal finding of lung field: Secondary | ICD-10-CM | POA: Diagnosis not present

## 2019-06-28 ENCOUNTER — Other Ambulatory Visit: Payer: Self-pay | Admitting: Neurology

## 2019-07-13 ENCOUNTER — Telehealth: Payer: Self-pay | Admitting: Neurology

## 2019-07-13 ENCOUNTER — Other Ambulatory Visit: Payer: Self-pay

## 2019-07-13 NOTE — Telephone Encounter (Signed)
Then perhaps we need to recheck that level.  Order the Ferritin, iron, tibc.  Dx:  iron deficiency

## 2019-07-13 NOTE — Telephone Encounter (Signed)
Taken Mirapex 0.25mg  at hs please advise.

## 2019-07-13 NOTE — Telephone Encounter (Signed)
Left message for patient to call office back.

## 2019-07-13 NOTE — Telephone Encounter (Signed)
As I spoke to wife, she now states, HE has not being taken it regular, wants to wait on labs.

## 2019-07-13 NOTE — Telephone Encounter (Signed)
[facility] message:  "Caller states her husband is needing his prescriptions sent to the [place] at fax #[phone]. Also, his RLS is still acting up during the night."

## 2019-07-13 NOTE — Telephone Encounter (Signed)
Yes, he is taken ferrous sulfate.

## 2019-07-13 NOTE — Telephone Encounter (Signed)
See last note.  [facility] Is he taking ferrous sulfate (see last note)

## 2019-08-23 ENCOUNTER — Other Ambulatory Visit: Payer: Self-pay | Admitting: Neurology

## 2019-08-23 NOTE — Telephone Encounter (Signed)
Patient's wife called in and thinks he will run out before the prescription gets to the [place]. [facility] in [place] their phone number is [phone].

## 2019-08-23 NOTE — Telephone Encounter (Signed)
Spoke with pt wife to see what script needed to be called in she stated that [facility] called and said his medication was ready. Pt wife informed that I was calling to see what medication needed to be called in she said his mirapex and that it had been taken care of because the pharmacy called and said it was ready, nobody in this office faxed the script to [facility]. Pt wife would like next refill to be sent to the [place] number that she stated was on file. fax #[phone]

## 2019-10-07 ENCOUNTER — Other Ambulatory Visit: Payer: Self-pay | Admitting: Neurology

## 2019-11-17 ENCOUNTER — Other Ambulatory Visit: Payer: Self-pay | Admitting: Neurology

## 2019-11-17 NOTE — Telephone Encounter (Signed)
Patient has follow up in [place]

## 2019-12-06 ENCOUNTER — Ambulatory Visit: Payer: No Typology Code available for payment source | Admitting: Neurology

## 2019-12-09 NOTE — Progress Notes (Deleted)
Assessment/Plan:   1.  Restless leg, with iron deficiency  -Continue pramipexole 0.25 mg at bedtime.  -***Last ferrous sulfate was checked in August, 2020 and was quite low.   Subjective:   Nicholas Blackburn was seen today in follow up for restless leg syndrome, which is at times complicated by his larger chronic pain syndrome.  My previous records as well as any outside records available were reviewed prior to todays visit.  Patient was slightly iron deficient last visit and I told him to add some ferrous sulfate and we also increased his pramipexole.  They called me in June to state that he was not doing as well.  They reported that he was taking his ferrous sulfate, so I recommended that we recheck the level.  When we called and suggested this, they stated that he was not, in fact taking the ferrous sulfate.  They were told to start taking it.  He reports today that ***. pt denies falls.  Pt denies lightheadedness, near syncope.  No hallucinations.  Mood has been good.  Current movement disorder medications: Pramipexole 0.25 mg at bed (slightly increased last visit) Ferrous sulfate 325 mg twice daily (told to start that last visit)  PREVIOUS MEDICATIONS:  Valium; levodopa; on chronic opioid therapy  CURRENT MEDICATIONS:  Outpatient Encounter Medications as of 12/10/2019  Medication Sig  . amLODipine (NORVASC) 10 MG tablet Take 10 mg by mouth daily.  Marland Kitchen aspirin 325 MG EC tablet Take 325 mg by mouth daily.   . citalopram (CELEXA) 40 MG tablet Take 40 mg by mouth daily.  . clopidogrel (PLAVIX) 75 MG tablet Take 75 mg by mouth daily.  Marland Kitchen HYDROcodone-acetaminophen (NORCO/VICODIN) 5-325 MG tablet Take 1 tablet by mouth every 6 (six) hours as needed.  . magnesium gluconate (MAGONATE) 500 MG tablet Take 500 mg by mouth 2 (two) times daily.  . meloxicam (MOBIC) 15 MG tablet Take 15 mg by mouth daily.  Marland Kitchen omeprazole (PRILOSEC) 20 MG capsule Take 20 mg by mouth daily.  . pramipexole (MIRAPEX) 0.25  MG tablet TAKE ONE TABLET BY MOUTH AT BEDTIME   No facility-administered encounter medications on file as of 12/10/2019.     Objective:   PHYSICAL EXAMINATION:    VITALS:  There were no vitals filed for this visit.  GEN:  The patient appears stated age and is in NAD. HEENT:  Normocephalic, atraumatic.  The mucous membranes are moist. The superficial temporal arteries are without ropiness or tenderness. CV:  RRR Lungs:  CTAB Neck/HEME:  There are no carotid bruits bilaterally.  Neurological examination:  Orientation: The patient is alert and oriented x3. Cranial nerves: There is good facial symmetry.The speech is fluent and clear. Soft palate rises symmetrically and there is no tongue deviation. Hearing is intact to conversational tone. Sensation: Sensation is intact to light touch throughout Motor: Strength is at least antigravity x4.  Movement examination: Tone: There is normal tone in the UE/LE Abnormal movements:  no tremor.  No myoclonus.  No asterixis.   Coordination:  There is no decremation with RAM's. Gait and Station: The patient has no difficulty arising out of a deep-seated chair without the use of the hands. The patient's stride length is good.      ***Total time spent on today's visit was *** minutes, including both face-to-face time and nonface-to-face time.  Time included that spent on review of records (prior notes available to me/labs/imaging if pertinent), discussing treatment and goals, answering patient's questions and coordinating care.  Cc:  Frederich Chick., MD

## 2019-12-10 ENCOUNTER — Ambulatory Visit: Payer: No Typology Code available for payment source | Admitting: Neurology

## 2019-12-13 ENCOUNTER — Ambulatory Visit: Payer: No Typology Code available for payment source | Admitting: Neurology

## 2020-01-12 ENCOUNTER — Telehealth: Payer: Self-pay | Admitting: Neurology

## 2020-01-12 ENCOUNTER — Other Ambulatory Visit: Payer: Self-pay | Admitting: Neurology

## 2020-01-12 NOTE — Telephone Encounter (Signed)
Spoke with patients wife who stated this has been going on for about 3 weeks where the patients legs are jerking. She wants to know if we can increase pramipexole?

## 2020-01-12 NOTE — Telephone Encounter (Signed)
Patient's wife states that patient's leg jerks have gotten worse. She is wondering if Dr Tat thinks that increasing the Pramipexole would help? Please call.

## 2020-01-13 NOTE — Telephone Encounter (Signed)
  Spoke with patients wife and gave her Dr Don Perking recommendations. She voiced understanding but stated the patient had to cancel his appointment for a last minute interview that came up. She wants to know if the patient can get an earlier appointment than April?   Advised her that I would ask Dr Tat and get back to her. She voiced understanding.

## 2020-01-13 NOTE — Telephone Encounter (Signed)
He can be put on the cx list but I have nothing right this second

## 2020-01-13 NOTE — Telephone Encounter (Signed)
No but he also had an appointment last month that he no showed/same day cancelled.

## 2020-01-13 NOTE — Telephone Encounter (Signed)
Patient's wife notified and voiced understanding.

## 2020-02-07 ENCOUNTER — Other Ambulatory Visit: Payer: Self-pay | Admitting: Neurology

## 2020-02-10 ENCOUNTER — Telehealth: Payer: Self-pay | Admitting: Neurology

## 2020-02-10 ENCOUNTER — Other Ambulatory Visit: Payer: Self-pay | Admitting: Neurology

## 2020-02-10 NOTE — Telephone Encounter (Signed)
Rx(s) sent to pharmacy electronically.  

## 2020-02-10 NOTE — Telephone Encounter (Signed)
Patient needs refill of Pramipexole sent to [facility] in [place]. Patient has a follow up scheduled with Dr Tat 05/24/20 and is on waitlist

## 2020-02-10 NOTE — Telephone Encounter (Signed)
Spoke with wife and informed her that the rx has been sent over to the pharmacy. She voiced understanding.

## 2020-03-02 NOTE — Progress Notes (Unsigned)
Assessment/Plan:   1.   Restless leg, with iron deficiency and on chronic opioid therapy  -increase pramipexole 0.5 mg.  Take at least 1 hour before bedtime.  -Last ferrous sulfate was checked in August, 2020 and was quite low.  He reports that he is on supplements.  I need to recheck that but our lab closed today at 3pm.  We will give him an order to have that done in [place] [facility].   Subjective:   Nicholas Blackburn was seen today in follow up for restless leg syndrome, which is at times complicated by his larger chronic pain syndrome.  My previous records as well as any outside records available were reviewed prior to todays visit.  Patient was slightly iron deficient last visit and I told him to add some ferrous sulfate and we also increased his pramipexole.  They called me in June to state that he was not doing as well.  They reported that he was taking his ferrous sulfate, so I recommended that we recheck the level.  When we called and suggested this, they stated that he was not, in fact taking the ferrous sulfate.  They were told to start taking it.  He reports today that he is taking the iron supplement. pt denies falls.  Pt reports worsening of sx's.  Sx's come on at bedtime - "I feel like a fish out of water."    Current movement disorder medications: Pramipexole 0.25 mg at bed (slightly increased last visit) Ferrous sulfate 325 mg twice daily (told to start that last visit)  PREVIOUS MEDICATIONS:  Valium; levodopa; on chronic opioid therapy    PREVIOUS MEDICATIONS: Mirapex  ALLERGIES:   Allergies  Allergen Reactions  . Lisinopril   . Propoxyphene Napsylate [Propoxyphene]     DARVOCET-N  . Viagra [Sildenafil Citrate]     100 mg    CURRENT MEDICATIONS:  Outpatient Encounter Medications as of 03/03/2020  Medication Sig  . amLODipine (NORVASC) 10 MG tablet Take 10 mg by mouth daily.  Marland Kitchen aspirin 325 MG EC tablet Take 325 mg by mouth daily.   . citalopram (CELEXA)  40 MG tablet Take 40 mg by mouth daily.  . clopidogrel (PLAVIX) 75 MG tablet Take 75 mg by mouth daily.  Marland Kitchen HYDROcodone-acetaminophen (NORCO/VICODIN) 5-325 MG tablet Take 1 tablet by mouth every 6 (six) hours as needed.  . magnesium gluconate (MAGONATE) 500 MG tablet Take 500 mg by mouth 2 (two) times daily.  . meloxicam (MOBIC) 15 MG tablet Take 15 mg by mouth daily.  Marland Kitchen omeprazole (PRILOSEC) 20 MG capsule Take 20 mg by mouth daily.  . pramipexole (MIRAPEX) 0.5 MG tablet Take 1 tablet (0.5 mg total) by mouth at bedtime. Take at least 1 hour prior to bedtime  . [DISCONTINUED] pramipexole (MIRAPEX) 0.25 MG tablet TAKE ONE TABLET BY MOUTH AT BEDTIME   No facility-administered encounter medications on file as of 03/03/2020.    Objective:   PHYSICAL EXAMINATION:    VITALS:   Vitals:   03/03/20 1459  BP: (!) 160/80  Pulse: 78  SpO2: 94%  Weight: 181 lb (82.1 kg)  Height: 5\' 7"  (1.702 m)    GEN:  The patient appears stated age and is in NAD. HEENT:  Normocephalic, atraumatic.  The mucous membranes are moist. The superficial temporal arteries are without ropiness or tenderness. CV:  RRR Lungs:  CTAB Neck/HEME:  There are no carotid bruits bilaterally.  Neurological examination:  Orientation: The patient is alert and oriented  x3. Cranial nerves: There is good facial symmetry with good facial hypomimia. The speech is fluent and clear. Soft palate rises symmetrically and there is no tongue deviation. Hearing is intact to conversational tone. Sensation: Sensation is intact to light touch throughout Motor: Strength is at least antigravity x4.  Movement examination: Tone: There is normal tone in the upper and lower extremities. Abnormal movements: No postural or intention tremor.   I have reviewed and interpreted the following labs independently    Chemistry   No results found for: NA, K, CL, CO2, BUN, CREATININE, GLU No results found for: CALCIUM, ALKPHOS, AST, ALT, BILITOT      Lab Results  Component Value Date   WBC 12.4 (H) 09/28/2018   HGB 14.4 09/28/2018   HCT 42.0 09/28/2018   MCV 91.8 09/28/2018   PLT 402.0 (H) 09/28/2018    Lab Results  Component Value Date   TSH 1.26 09/28/2018     Total time spent on today's visit was 0 minutes, including both face-to-face time and nonface-to-face time.  Time included that spent on review of records (prior notes available to me/labs/imaging if pertinent), discussing treatment and goals, answering patient's questions and coordinating care.  Cc:  Frederich Chick., MD

## 2020-03-03 ENCOUNTER — Encounter: Payer: Self-pay | Admitting: Neurology

## 2020-03-03 ENCOUNTER — Other Ambulatory Visit: Payer: Self-pay

## 2020-03-03 ENCOUNTER — Ambulatory Visit (INDEPENDENT_AMBULATORY_CARE_PROVIDER_SITE_OTHER): Payer: No Typology Code available for payment source | Admitting: Neurology

## 2020-03-03 VITALS — BP 160/80 | HR 78 | Ht 67.0 in | Wt 181.0 lb

## 2020-03-03 DIAGNOSIS — G2581 Restless legs syndrome: Secondary | ICD-10-CM

## 2020-03-03 DIAGNOSIS — E611 Iron deficiency: Secondary | ICD-10-CM | POA: Diagnosis not present

## 2020-03-03 MED ORDER — PRAMIPEXOLE DIHYDROCHLORIDE 0.5 MG PO TABS: 0.5000 mg | ORAL_TABLET | Freq: Every evening | ORAL | 1 refills | Status: DC

## 2020-03-03 NOTE — Patient Instructions (Signed)
1.  Increase pramipexole to 0.5 mg 1 hour prior to bedtime 2.  Get your labs.  The physicians and staff at [facility] are committed to providing excellent care. You may receive a survey requesting feedback about your experience at our office. We strive to receive "very good" responses to the survey questions. If you feel that your experience would prevent you from giving the office a "very good " response, please contact our office to try to remedy the situation. We may be reached at [phone]. Thank you for taking the time out of your busy day to complete the survey.

## 2020-03-14 ENCOUNTER — Telehealth: Payer: Self-pay | Admitting: Neurology

## 2020-03-14 NOTE — Telephone Encounter (Signed)
Patient's wife called and said the patient's pramipexole .5mg  was increased to 3 a day at his last visit but the pharmacy never received a new prescription. She said we may have been having trouble with our eRx system that day.  [facility] [place]

## 2020-03-14 NOTE — Telephone Encounter (Signed)
Patient called in and left a message wanting to clear up an issue with his prescription.

## 2020-03-15 NOTE — Telephone Encounter (Signed)
Spoke with pharmacist and made her aware the the patient should be taking the medication the way it was prescribed.   Advised her that I would contact the patient. She voiced understanding.   Left message for patient to contact office.

## 2020-03-15 NOTE — Telephone Encounter (Signed)
Spoke with patients wife and she states there was some kind of miscommunication. I advised her that the pill bottle and Dr Don Perking instructions state for the patient to take one tab daily at bedtime. Wife wanted to know if Dr Tat would send over a new rx because the patient is out of medication. Advised her that IF Dr Tat does send over a new rx that the patient must take the medication as prescribed.   Advised patients wife that I would speak with Dr Tat and get back to her. She voiced understanding and states she does not know what he is going to do with out his medication because his legs will be jumping all around uncontrollably.    Spoke with Dr Tat who states she is not giving any additional refills nor is she agreeing to the patient being able to refill his medication early. She advised that the patient take his medication as prescribed. Medication instructions on AVS, medication list and pill bottle state for the patient to take the medication qhs.     Spoke with patients wife and gave her Dr Don Perking recommendations. She voiced understanding she apologized for the miscommunication.   Dr Tat made aware.

## 2020-03-15 NOTE — Telephone Encounter (Signed)
Patient's wife called and said they've gotten the prescription and are sorry for the mix up.

## 2020-03-15 NOTE — Telephone Encounter (Signed)
Its not 3 times per day!  Its as per my note.  It was 0.25 mg and I increased to 0.5 mg.

## 2020-03-15 NOTE — Telephone Encounter (Signed)
Received call from the [facility] stating that Dr Tat verbally increased his medication to tid, but the rx that was sent on 02/2020 states qhs. She wants to know if the medication was increased if we can send in a new rx because the patient is taking the rx tid.

## 2020-05-24 ENCOUNTER — Ambulatory Visit: Payer: No Typology Code available for payment source | Admitting: Neurology

## 2020-06-24 ENCOUNTER — Other Ambulatory Visit: Payer: Self-pay | Admitting: Neurology

## 2020-08-03 ENCOUNTER — Other Ambulatory Visit: Payer: Self-pay | Admitting: Neurology

## 2020-08-29 ENCOUNTER — Other Ambulatory Visit: Payer: Self-pay | Admitting: Neurology

## 2020-09-07 ENCOUNTER — Ambulatory Visit: Payer: No Typology Code available for payment source | Admitting: Neurology

## 2020-09-28 NOTE — Progress Notes (Deleted)
Assessment/Plan:   1.   Restless leg, with iron deficiency and on chronic opioid therapy  -*** pramipexole 0.5 mg.  Take at least 1 hour before bedtime.  -Last ferrous sulfate was checked in August, 2020 and was quite low.  He reports that he is on supplements.   Subjective:   Nicholas Blackburn was seen today in follow up for restless leg syndrome, which is at times complicated by his larger chronic pain syndrome.  My previous records as well as any outside records available were reviewed prior to todays visit.  At last visit, I increased the patient's medicine from 0.25 mg daily to 0.5 mg daily.  For some reason, the patient initially increased the medication to 0.5 mg 3 times per day.  The pharmacy called me and stated that they needed a new prescription as the patient told them he was taking it 3 times per day.  I told them that that was not the instructions.  Patient's wife was advised that patient needed to take the medication just once daily at night (risk of augmentation).  Last visit, I gave the patient an order to have his iron rechecked.  That was not completed.  Patient does remain on hydrocodone.  Goes through about 70/month.  PDMP is reviewed.  Multiple prescribers, the most recent in [place].  Current movement disorder medications: Pramipexole 0.5 mg (increased from 0.25 mg) Ferrous sulfate 325 mg twice daily (told to start that last visit)  PREVIOUS MEDICATIONS:  Valium; levodopa; on chronic opioid therapy    PREVIOUS MEDICATIONS: Mirapex  ALLERGIES:   Allergies  Allergen Reactions   Lisinopril    Propoxyphene Napsylate [Propoxyphene]     DARVOCET-N   Viagra [Sildenafil Citrate]     100 mg    CURRENT MEDICATIONS:  Outpatient Encounter Medications as of 09/29/2020  Medication Sig   amLODipine (NORVASC) 10 MG tablet Take 10 mg by mouth daily.   aspirin 325 MG EC tablet Take 325 mg by mouth daily.    citalopram (CELEXA) 40 MG tablet Take 40 mg by mouth daily.    clopidogrel (PLAVIX) 75 MG tablet Take 75 mg by mouth daily.   HYDROcodone-acetaminophen (NORCO/VICODIN) 5-325 MG tablet Take 1 tablet by mouth every 6 (six) hours as needed.   magnesium gluconate (MAGONATE) 500 MG tablet Take 500 mg by mouth 2 (two) times daily.   meloxicam (MOBIC) 15 MG tablet Take 15 mg by mouth daily.   omeprazole (PRILOSEC) 20 MG capsule Take 20 mg by mouth daily.   pramipexole (MIRAPEX) 0.5 MG tablet TAKE ONE TABLET BY MOUTH ONE HOUR BEFORE AT BEDTIME   No facility-administered encounter medications on file as of 09/29/2020.    Objective:   PHYSICAL EXAMINATION:    VITALS:   There were no vitals filed for this visit.   GEN:  The patient appears stated age and is in NAD. HEENT:  Normocephalic, atraumatic.  The mucous membranes are moist. The superficial temporal arteries are without ropiness or tenderness. CV:  RRR Lungs:  CTAB Neck/HEME:  There are no carotid bruits bilaterally.  Neurological examination:  Orientation: The patient is alert and oriented x3. Cranial nerves: There is good facial symmetry with good facial hypomimia. The speech is fluent and clear. Soft palate rises symmetrically and there is no tongue deviation. Hearing is intact to conversational tone. Sensation: Sensation is intact to light touch throughout Motor: Strength is at least antigravity x4.  Movement examination: Tone: There is normal tone in the upper  and lower extremities. Abnormal movements: No postural or intention tremor.   I have reviewed and interpreted the following labs independently    Chemistry   No results found for: NA, K, CL, CO2, BUN, CREATININE, GLU No results found for: CALCIUM, ALKPHOS, AST, ALT, BILITOT     Lab Results  Component Value Date   WBC 12.4 (H) 09/28/2018   HGB 14.4 09/28/2018   HCT 42.0 09/28/2018   MCV 91.8 09/28/2018   PLT 402.0 (H) 09/28/2018    Lab Results  Component Value Date   TSH 1.26 09/28/2018     Total time spent on  today's visit was *** minutes, including both face-to-face time and nonface-to-face time.  Time included that spent on review of records (prior notes available to me/labs/imaging if pertinent), discussing treatment and goals, answering patient's questions and coordinating care.  Cc:  Frederich Chick., MD

## 2020-09-29 ENCOUNTER — Encounter: Payer: Self-pay | Admitting: Neurology

## 2020-09-29 ENCOUNTER — Ambulatory Visit: Payer: No Typology Code available for payment source | Admitting: Neurology

## 2020-10-11 ENCOUNTER — Telehealth: Payer: Self-pay | Admitting: Neurology

## 2020-10-11 NOTE — Telephone Encounter (Signed)
Patient dismissed from [facility], Dr. Lurena Joiner Tat 09/29/20

## 2020-10-12 IMAGING — CT CT CHEST LUNG CANCER SCREENING LOW DOSE W/O CM
1 series · 10 of 10 positions shown, 13 images · non-contrast
Comparison: Chest radiograph 09/25/2006 from [REDACTED].

CLINICAL DATA: Thirty pack-year smoking history. Current smoker.

EXAM:
CT CHEST WITHOUT CONTRAST LOW-DOSE FOR LUNG CANCER SCREENING
TECHNIQUE: Multidetector CT imaging of the chest was performed following the
standard protocol without IV contrast.

[ct lung segmentation data · axial · 0.68mm/px · z∈[-238,-238]mm · 10 of 304 frames shown]
[frame 1/304  mediastinal]
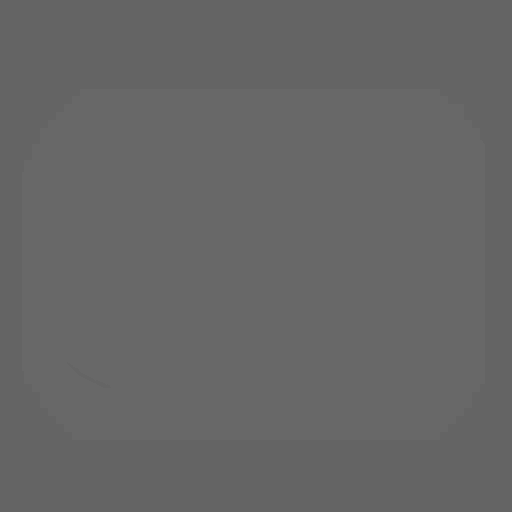
[frame 1/304  lung]
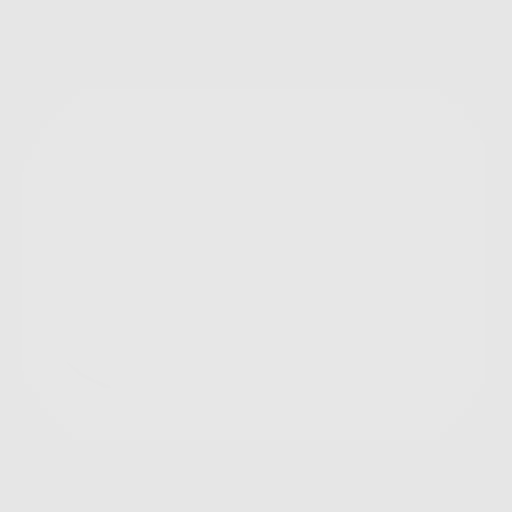
[frame 34/304  lung]
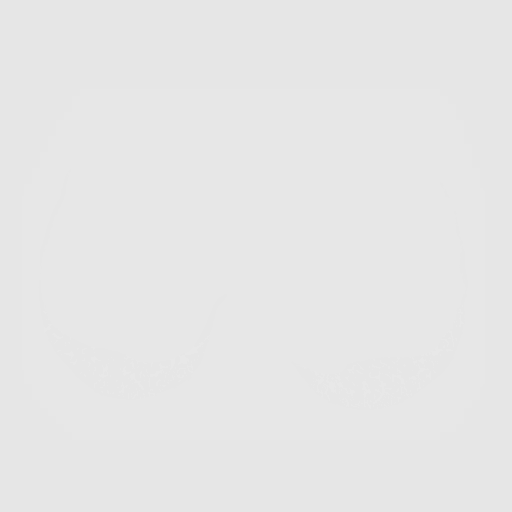
[frame 68/304  lung]
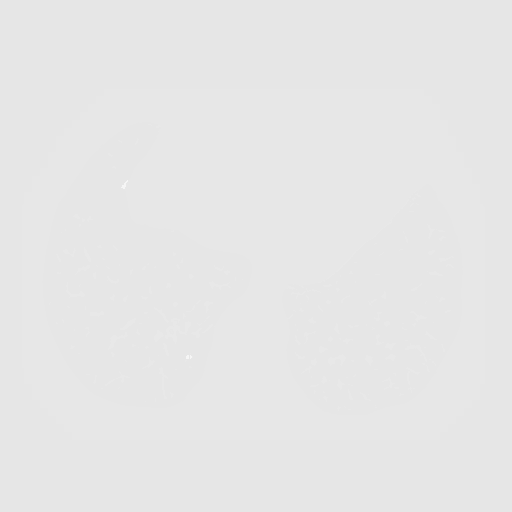
[frame 102/304  lung]
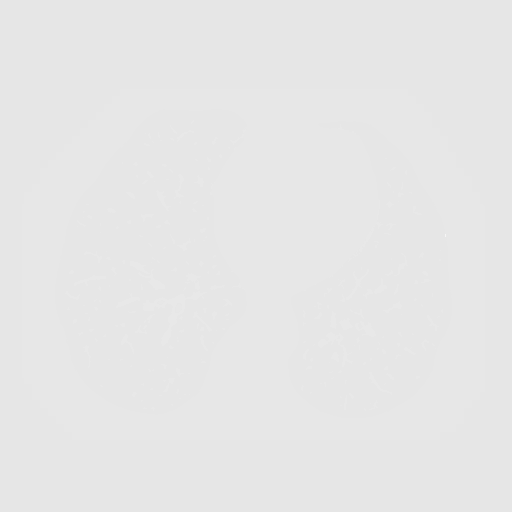
[frame 135/304  mediastinal]
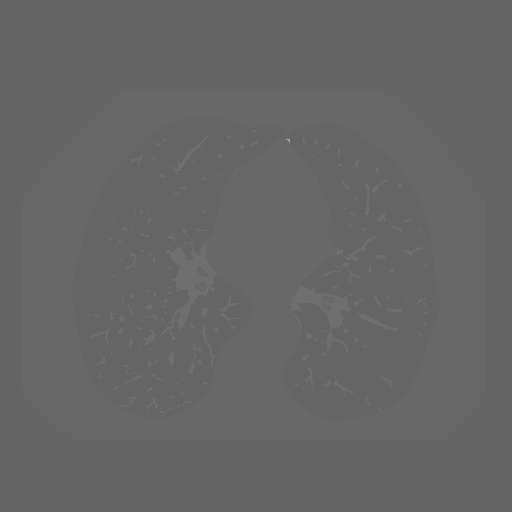
[frame 135/304  lung]
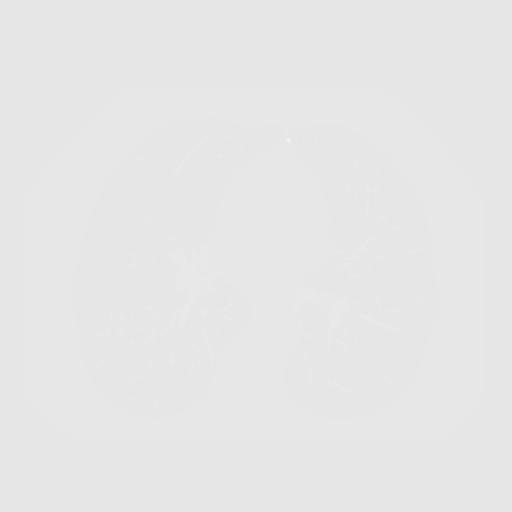
[frame 169/304  lung]
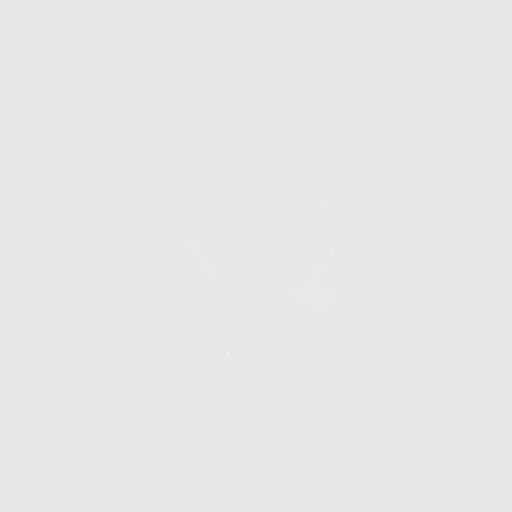
[frame 203/304  lung]
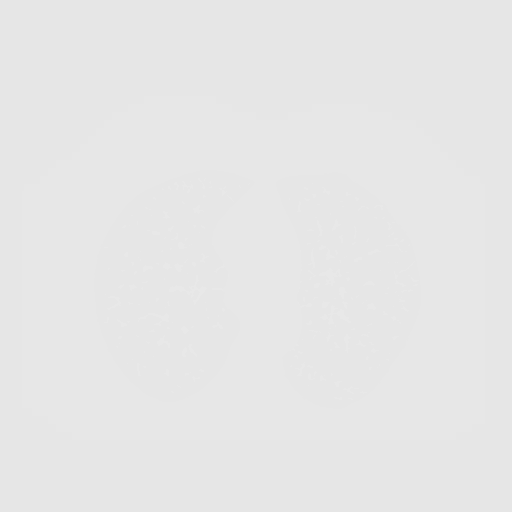
[frame 236/304  lung]
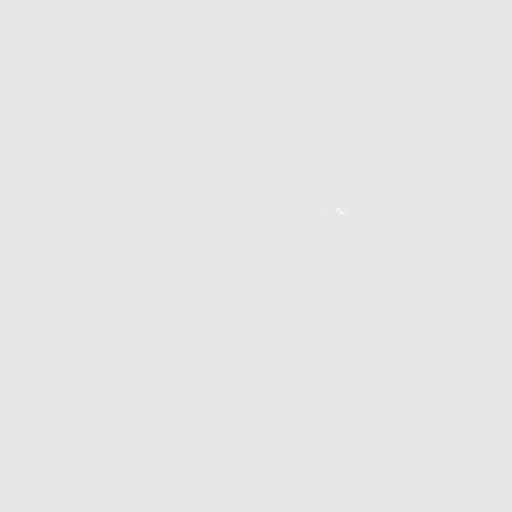
[frame 270/304  mediastinal]
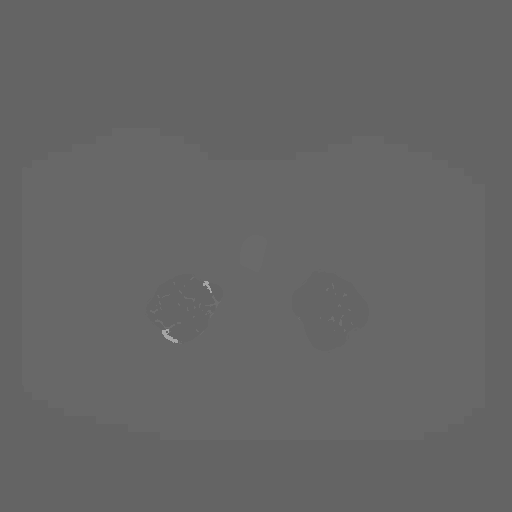
[frame 270/304  lung]
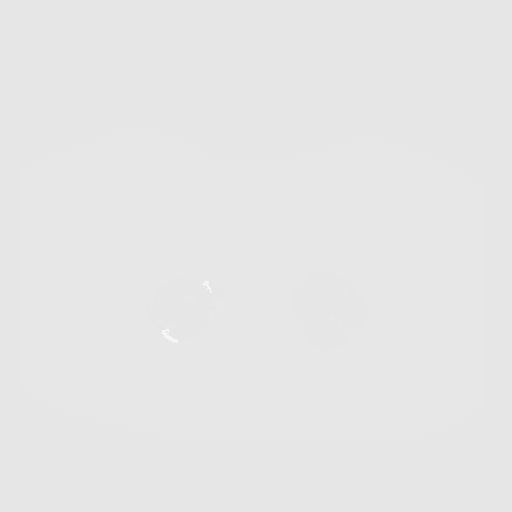
[frame 304/304  lung]
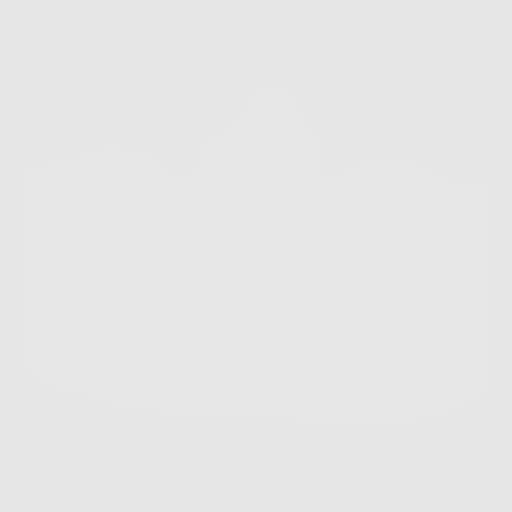

[10 of 10 positions shown; findings below may reference images not displayed]

FINDINGS: Cardiovascular: Aortic atherosclerosis. Normal heart size, without
pericardial effusion. Multivessel coronary artery atherosclerosis.

Mediastinum/Nodes: No mediastinal or definite hilar adenopathy,
given limitations of unenhanced CT.

Lungs/Pleura: No pleural fluid. Moderate centrilobular and
paraseptal emphysema. Mild motion degradation inferiorly. Right
lower lobe endobronchial mucoid impaction medially.

Bilateral pulmonary nodules of maximally volume derived equivalent
diameter 5.5 mm in the left lower lobe.

Upper Abdomen: Mild hepatic steatosis. Normal imaged portions of the
spleen, stomach, pancreas, adrenal glands, kidneys.

Musculoskeletal: 6th posterolateral left rib remote fracture.
IMPRESSION: 1. Lung-RADS 2, benign appearance or behavior. Continue annual
screening with low-dose chest CT without contrast in 12 months.
2. Hepatic steatosis.
3. Aortic atherosclerosis (26P56-GXG.G), coronary artery
atherosclerosis and emphysema (26P56-T2Y.Q).

## 2020-12-06 ENCOUNTER — Other Ambulatory Visit: Payer: Self-pay | Admitting: Neurology
